# Patient Record
Sex: Female | Born: 1995
Health system: Southern US, Community
[De-identification: ages and names within clinical notes are randomized; demographics above are authoritative.]

## PROBLEM LIST (undated history)

## (undated) DIAGNOSIS — F419 Anxiety disorder, unspecified: Secondary | ICD-10-CM

## (undated) HISTORY — DX: Anxiety disorder, unspecified: F41.9

## (undated) HISTORY — PX: WISDOM TOOTH EXTRACTION: SHX21

---
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
## 2017-03-13 DIAGNOSIS — Z135 Encounter for screening for eye and ear disorders: Secondary | ICD-10-CM | POA: Diagnosis not present

## 2017-03-13 DIAGNOSIS — S060X0A Concussion without loss of consciousness, initial encounter: Secondary | ICD-10-CM | POA: Diagnosis not present

## 2017-03-16 DIAGNOSIS — S060X0A Concussion without loss of consciousness, initial encounter: Secondary | ICD-10-CM | POA: Diagnosis not present

## 2017-03-16 DIAGNOSIS — Z135 Encounter for screening for eye and ear disorders: Secondary | ICD-10-CM | POA: Diagnosis not present

## 2018-05-07 DIAGNOSIS — D225 Melanocytic nevi of trunk: Secondary | ICD-10-CM | POA: Diagnosis not present

## 2018-05-07 DIAGNOSIS — D2230 Melanocytic nevi of unspecified part of face: Secondary | ICD-10-CM | POA: Diagnosis not present

## 2018-05-07 DIAGNOSIS — D2260 Melanocytic nevi of unspecified upper limb, including shoulder: Secondary | ICD-10-CM | POA: Diagnosis not present

## 2018-05-07 DIAGNOSIS — Z1283 Encounter for screening for malignant neoplasm of skin: Secondary | ICD-10-CM | POA: Diagnosis not present

## 2018-08-14 DIAGNOSIS — Z6820 Body mass index (BMI) 20.0-20.9, adult: Secondary | ICD-10-CM | POA: Diagnosis not present

## 2018-08-14 DIAGNOSIS — Z113 Encounter for screening for infections with a predominantly sexual mode of transmission: Secondary | ICD-10-CM | POA: Diagnosis not present

## 2018-08-14 DIAGNOSIS — Z1159 Encounter for screening for other viral diseases: Secondary | ICD-10-CM | POA: Diagnosis not present

## 2018-08-14 DIAGNOSIS — Z118 Encounter for screening for other infectious and parasitic diseases: Secondary | ICD-10-CM | POA: Diagnosis not present

## 2018-08-14 DIAGNOSIS — Z114 Encounter for screening for human immunodeficiency virus [HIV]: Secondary | ICD-10-CM | POA: Diagnosis not present

## 2018-08-14 DIAGNOSIS — Z01419 Encounter for gynecological examination (general) (routine) without abnormal findings: Secondary | ICD-10-CM | POA: Diagnosis not present

## 2018-09-28 DIAGNOSIS — Z0184 Encounter for antibody response examination: Secondary | ICD-10-CM | POA: Diagnosis not present

## 2018-09-28 DIAGNOSIS — Z0000 Encounter for general adult medical examination without abnormal findings: Secondary | ICD-10-CM | POA: Diagnosis not present

## 2018-09-28 DIAGNOSIS — Z23 Encounter for immunization: Secondary | ICD-10-CM | POA: Diagnosis not present

## 2018-09-28 DIAGNOSIS — Z0100 Encounter for examination of eyes and vision without abnormal findings: Secondary | ICD-10-CM | POA: Diagnosis not present

## 2018-09-28 DIAGNOSIS — Z111 Encounter for screening for respiratory tuberculosis: Secondary | ICD-10-CM | POA: Diagnosis not present

## 2018-09-30 DIAGNOSIS — Z23 Encounter for immunization: Secondary | ICD-10-CM | POA: Diagnosis not present

## 2018-11-02 DIAGNOSIS — Z23 Encounter for immunization: Secondary | ICD-10-CM | POA: Diagnosis not present

## 2018-11-09 DIAGNOSIS — J039 Acute tonsillitis, unspecified: Secondary | ICD-10-CM | POA: Diagnosis not present

## 2018-12-07 DIAGNOSIS — Z0184 Encounter for antibody response examination: Secondary | ICD-10-CM | POA: Diagnosis not present

## 2019-01-19 DIAGNOSIS — J069 Acute upper respiratory infection, unspecified: Secondary | ICD-10-CM | POA: Diagnosis not present

## 2019-01-23 DIAGNOSIS — R6889 Other general symptoms and signs: Secondary | ICD-10-CM | POA: Diagnosis not present

## 2019-01-23 DIAGNOSIS — J101 Influenza due to other identified influenza virus with other respiratory manifestations: Secondary | ICD-10-CM | POA: Diagnosis not present

## 2019-03-22 DIAGNOSIS — J039 Acute tonsillitis, unspecified: Secondary | ICD-10-CM | POA: Diagnosis not present

## 2019-07-17 ENCOUNTER — Encounter: Payer: Self-pay | Admitting: Emergency Medicine

## 2019-07-17 ENCOUNTER — Emergency Department: Payer: BC Managed Care – PPO

## 2019-07-17 ENCOUNTER — Emergency Department
Admission: EM | Admit: 2019-07-17 | Discharge: 2019-07-17 | Disposition: A | Discharge status: Home / Self Care | Payer: BC Managed Care – PPO | Attending: Emergency Medicine | Admitting: Emergency Medicine

## 2019-07-17 ENCOUNTER — Other Ambulatory Visit: Payer: Self-pay

## 2019-07-17 DIAGNOSIS — R600 Localized edema: Secondary | ICD-10-CM | POA: Diagnosis not present

## 2019-07-17 DIAGNOSIS — K047 Periapical abscess without sinus: Secondary | ICD-10-CM | POA: Insufficient documentation

## 2019-07-17 DIAGNOSIS — R220 Localized swelling, mass and lump, head: Secondary | ICD-10-CM

## 2019-07-17 DIAGNOSIS — K0889 Other specified disorders of teeth and supporting structures: Secondary | ICD-10-CM | POA: Diagnosis not present

## 2019-07-17 LAB — CBC WITH DIFFERENTIAL/PLATELET
Abs Immature Granulocytes: 0.04 10*3/uL (ref 0.00–0.07)
Basophils Absolute: 0 10*3/uL (ref 0.0–0.1)
Basophils Relative: 0 %
Eosinophils Absolute: 0.1 10*3/uL (ref 0.0–0.5)
Eosinophils Relative: 1 %
HCT: 38.1 % (ref 36.0–46.0)
Hemoglobin: 12.3 g/dL (ref 12.0–15.0)
Immature Granulocytes: 0 %
Lymphocytes Relative: 15 %
Lymphs Abs: 1.4 10*3/uL (ref 0.7–4.0)
MCH: 28.4 pg (ref 26.0–34.0)
MCHC: 32.3 g/dL (ref 30.0–36.0)
MCV: 88 fL (ref 80.0–100.0)
Monocytes Absolute: 0.7 10*3/uL (ref 0.1–1.0)
Monocytes Relative: 8 %
Neutro Abs: 7 10*3/uL (ref 1.7–7.7)
Neutrophils Relative %: 76 %
Platelets: 223 10*3/uL (ref 150–400)
RBC: 4.33 MIL/uL (ref 3.87–5.11)
RDW: 13.7 % (ref 11.5–15.5)
WBC: 9.3 10*3/uL (ref 4.0–10.5)
nRBC: 0 % (ref 0.0–0.2)

## 2019-07-17 LAB — BASIC METABOLIC PANEL
Anion gap: 9 (ref 5–15)
BUN: 8 mg/dL (ref 6–20)
CO2: 21 mmol/L — ABNORMAL LOW (ref 22–32)
Calcium: 8.7 mg/dL — ABNORMAL LOW (ref 8.9–10.3)
Chloride: 108 mmol/L (ref 98–111)
Creatinine, Ser: 0.71 mg/dL (ref 0.44–1.00)
GFR calc Af Amer: >60 mL/min (ref >60)
GFR calc non Af Amer: >60 mL/min (ref >60)
Glucose, Bld: 80 mg/dL (ref 70–99)
Potassium: 3.7 mmol/L (ref 3.5–5.1)
Sodium: 138 mmol/L (ref 135–145)

## 2019-07-17 MED ORDER — IOHEXOL 300 MG/ML  SOLN
75.0000 mL | Freq: Once | INTRAMUSCULAR | Status: AC | PRN
  Administered 2019-07-17: 75 mL via INTRAVENOUS

## 2019-07-17 MED ORDER — LIDOCAINE HCL (PF) 1 % IJ SOLN
5.0000 mL | Freq: Once | INTRAMUSCULAR | Status: DC
  Filled 2019-07-17: qty 5

## 2019-07-17 MED ORDER — LIDOCAINE-EPINEPHRINE 2 %-1:100000 IJ SOLN
1.7000 mL | Freq: Once | INTRAMUSCULAR | Status: AC
  Administered 2019-07-17: 1.7 mL
  Filled 2019-07-17: qty 1.7

## 2019-07-17 MED ORDER — BUTAMBEN-TETRACAINE-BENZOCAINE 2-2-14 % EX AERO
1.0000 | INHALATION_SPRAY | Freq: Once | CUTANEOUS | Status: AC
  Administered 2019-07-17: 1 via TOPICAL
  Filled 2019-07-17: qty 20

## 2019-07-17 MED ORDER — MORPHINE SULFATE (PF) 4 MG/ML IV SOLN
4.0000 mg | Freq: Once | INTRAVENOUS | Status: AC
  Administered 2019-07-17: 4 mg via INTRAVENOUS
  Filled 2019-07-17: qty 1

## 2019-07-17 MED ORDER — LACTATED RINGERS IV BOLUS
1000.0000 mL | Freq: Once | INTRAVENOUS | Status: AC
  Administered 2019-07-17: 1000 mL via INTRAVENOUS

## 2019-07-17 MED ORDER — OXYCODONE-ACETAMINOPHEN 5-325 MG PO TABS: 1.0000 | ORAL_TABLET | ORAL | 0 refills | Status: DC | PRN

## 2019-07-17 MED ORDER — KETOROLAC TROMETHAMINE 30 MG/ML IJ SOLN
15.0000 mg | Freq: Once | INTRAMUSCULAR | Status: AC
  Administered 2019-07-17: 14:00:00 15 mg via INTRAVENOUS
  Filled 2019-07-17: qty 1

## 2019-07-17 MED ORDER — CLINDAMYCIN HCL 300 MG PO CAPS: 300.0000 mg | ORAL_CAPSULE | Freq: Three times a day (TID) | ORAL | 0 refills | Status: AC

## 2019-07-17 MED ORDER — CLINDAMYCIN PHOSPHATE 900 MG/50ML IV SOLN
900.0000 mg | Freq: Once | INTRAVENOUS | Status: AC
  Administered 2019-07-17: 900 mg via INTRAVENOUS
  Filled 2019-07-17: qty 50

## 2019-07-17 NOTE — ED Notes (Signed)
Urine preg NEG. 

## 2019-07-17 NOTE — ED Notes (Signed)
EDP Jess[place] notified in person that pt's pain inc again; pt states morphine made her very sleepy but didn't dec pain much. Pt attempting for urine sample now. If urine preg neg; EDP verbal for ketorolac.

## 2019-07-17 NOTE — ED Notes (Signed)
Lidocaine & cetacaine at bedside with equipment. Will notify EDP [place].

## 2019-07-17 NOTE — ED Provider Notes (Signed)
[facility] Emergency Department Provider Note   ____________________________________________   First MD Initiated Contact with Patient 07/17/19 1130     (approximate)  I have reviewed the triage vital signs and the nursing notes.   HISTORY  Chief Complaint Facial Swelling    HPI Madison Diaz is a 23 y.o. female with no significant past medical history presents to the ED complaining of facial pain and swelling.  Patient reports approximately 4 days of significant pain along her left lower molar.  She is now having pain with swallowing and reports swelling along the left side of her face and jaw.  She denies any fevers.  She does state she feels like her spit gets stuck in the back of her throat.  She spoke to her dentist over the phone, was prescribed amoxicillin and has taken about 4 doses.  She spoke to her dentist on the phone again today, who told her to come to the ED to get evaluated due to worsening swelling.        History reviewed. No pertinent past medical history.  There are no active problems to display for this patient.   History reviewed. No pertinent surgical history.  Prior to Admission medications   Medication Sig Start Date End Date Taking? Authorizing Provider  clindamycin (CLEOCIN) 300 MG capsule Take 1 capsule (300 mg total) by mouth 3 (three) times daily for 10 days. 07/17/19 07/27/19  Blake Divine, MD  oxyCODONE-acetaminophen (PERCOCET) 5-325 MG tablet Take 1 tablet by mouth every 4 (four) hours as needed for severe pain. 07/17/19 07/16/20  Blake Divine, MD    Allergies Patient has no known allergies.  No family history on file.  Social History Social History   Tobacco Use  . Smoking status: Never Smoker  . Smokeless tobacco: Never Used  Substance Use Topics  . Alcohol use: Not on file  . Drug use: Not on file    Review of Systems  Constitutional: No fever/chills Eyes: No visual changes. ENT: Positive for  sore throat, dental pain, and facial swelling. Cardiovascular: Denies chest pain. Respiratory: Denies shortness of breath. Gastrointestinal: No abdominal pain.  No nausea, no vomiting.  No diarrhea.  No constipation. Genitourinary: Negative for dysuria. Musculoskeletal: Negative for back pain. Skin: Negative for rash. Neurological: Negative for headaches, focal weakness or numbness.  ____________________________________________   PHYSICAL EXAM:  VITAL SIGNS: ED Triage Vitals  Enc Vitals Group     BP 07/17/19 1122 (!) 134/105     Pulse Rate 07/17/19 1122 (!) 128     Resp 07/17/19 1122 18     Temp 07/17/19 1122 99 F (37.2 C)     Temp Source 07/17/19 1122 Oral     SpO2 07/17/19 1122 100 %     Weight 07/17/19 1124 125 lb (56.7 kg)     Height 07/17/19 1124 5\' 6"  (1.676 m)     Head Circumference --      Peak Flow --      Pain Score 07/17/19 1124 9     Pain Loc --      Pain Edu? --      Excl. in [place]? --     Constitutional: Alert and oriented. Eyes: Conjunctivae are normal. Head: Atraumatic. Nose: No congestion/rhinnorhea. Mouth/Throat: Mucous membranes are moist.  Edema with focal tenderness to palpation in the area of left lower molar with apparent area of fluctuance.  Some submandibular edema and tenderness along with tenderness along outside of mandible.  No  trismus and she is tolerating secretions without difficulty. Neck: Normal ROM Cardiovascular: Normal rate, regular rhythm. Grossly normal heart sounds. Respiratory: Normal respiratory effort.  No retractions. Lungs CTAB. Gastrointestinal: Soft and nontender. No distention. Genitourinary: deferred Musculoskeletal: No lower extremity tenderness nor edema. Neurologic:  Normal speech and language. No gross focal neurologic deficits are appreciated. Skin:  Skin is warm, dry and intact. No rash noted. Psychiatric: Mood and affect are normal. Speech and behavior are normal.  ____________________________________________    LABS (all labs ordered are listed, but only abnormal results are displayed)  Labs Reviewed  BASIC METABOLIC PANEL - Abnormal; Notable for the following components:      Result Value   CO2 21 (*)    Calcium 8.7 (*)    All other components within normal limits  CBC WITH DIFFERENTIAL/PLATELET  POC URINE PREG, ED     PROCEDURES  Procedure(s) performed (including Critical Care):  Marland KitchenMarland KitchenIncision and Drainage  Date/Time: 07/17/2019 4:03 PM Performed by: Blake Divine, MD Authorized by: Blake Divine, MD   Consent:    Consent obtained:  Verbal   Consent given by:  Patient   Risks discussed:  Bleeding, incomplete drainage, pain, infection and damage to other organs   Alternatives discussed:  No treatment Location:    Type:  Abscess   Location:  Mouth   Mouth location:  Submandibular space Anesthesia (see MAR for exact dosages):    Anesthesia method:  Topical application and nerve block   Block location:  Inferior alveolar   Block needle gauge:  24 G   Block anesthetic:  Lidocaine 2% WITH epi   Block injection procedure:  Anatomic landmarks identified, negative aspiration for blood, incremental injection and introduced needle   Block outcome:  Anesthesia achieved Procedure type:    Complexity:  Simple Procedure details:    Needle aspiration: yes     Needle size:  22 G   Drainage:  Purulent and bloody   Drainage amount:  Scant   Wound treatment:  Wound left open Post-procedure details:    Patient tolerance of procedure:  Tolerated well, no immediate complications     ____________________________________________   INITIAL IMPRESSION / ASSESSMENT AND PLAN / ED COURSE       23 year old female presenting with pain along the area of left lower molar and worsening swelling with difficulty tolerating her spit.  She is not in any respiratory distress currently and appears to be tolerating her secretions without difficulty at this time.  Exam consistent with abscess in the  area of her left lower molar, not an easy location for bedside I&D.  Will obtain CT to further assess.  No evidence of Ludwig's angina as submandibular compartments are soft.  CT significant for alveolar abscess tracking into submandibular compartment.  Able to localize area of fluctuance with assistance of imaging, topical anesthesia applied with Cetacaine and inferior alveolar block performed.  Able to aspirate approximately 1 cc of purulent material using 22-gauge spinal needle, patient tolerated well without significant bleeding.  Will start patient on clindamycin, counseled on need to follow-up with her dentist for possible referral to oral surgery.  Otherwise counseled patient to return to the ED for new or worsening symptoms, patient agrees with plan.      ____________________________________________   FINAL CLINICAL IMPRESSION(S) / ED DIAGNOSES  Final diagnoses:  Dental abscess  Facial swelling     ED Discharge Orders         Ordered    oxyCODONE-acetaminophen (PERCOCET) 5-325 MG tablet  Every 4 hours PRN     07/17/19 1523    clindamycin (CLEOCIN) 300 MG capsule  3 times daily     07/17/19 1523           Note:  This document was prepared using Dragon voice recognition software and may include unintentional dictation errors.   Blake Divine, MD 07/17/19 1606

## 2019-07-17 NOTE — ED Notes (Signed)
Pt back to room.

## 2019-07-17 NOTE — ED Notes (Signed)
Pt given cup of iced water. Pt using sterile saline to swish and spit per EDP request.

## 2019-07-17 NOTE — ED Triage Notes (Addendum)
Pt arrived via POV with reports of impacted wisdom tooth on the left side, pt c/o swelling to left side of the face.  Pt c/o pain when swallowing. Pt states she started on antibiotics yesterday for dental abscess, pt states she has taken 4 doses of Amoxicillin. Pt also taking tylenol.  Pt has hx of tooth abscess in the past. Pt states she is having difficulty swallowing her saliva at times as well.   Pt able to talk in complete sentences, no respiratory distress noted.

## 2019-07-17 NOTE — ED Notes (Signed)
Pt given more warm blankets. Pt denies any other needs. Rail up. Bed locked low. Call bell within reach.

## 2019-07-17 NOTE — ED Notes (Signed)
Attempted for 20g IV at L ac.

## 2019-07-17 NOTE — ED Notes (Signed)
Will d/c once antibiotic finished. Not yet completed as pt kept bending arm. Pt understands to keep arm straight.

## 2019-07-17 NOTE — ED Notes (Signed)
Dental lidocaine given to [place].

## 2019-07-17 NOTE — ED Triage Notes (Signed)
First RN Note: pt presents to ED via POV with c/o possible impacted wisdom tooth. Pt's mother reports was referred by dentist. Pt states swelling to L side of face, feels like swelling is extending down into her throat and making it dificult to breathe. Pt is visualized in no respiratory distress at this time, ambulatory without difficulty at this time.

## 2019-07-17 NOTE — ED Notes (Signed)
Pt leaving for CT.  

## 2019-09-28 DIAGNOSIS — Z111 Encounter for screening for respiratory tuberculosis: Secondary | ICD-10-CM | POA: Diagnosis not present

## 2019-10-05 DIAGNOSIS — Z20828 Contact with and (suspected) exposure to other viral communicable diseases: Secondary | ICD-10-CM | POA: Diagnosis not present

## 2019-10-22 DIAGNOSIS — Z20828 Contact with and (suspected) exposure to other viral communicable diseases: Secondary | ICD-10-CM | POA: Diagnosis not present

## 2019-11-15 DIAGNOSIS — Z1151 Encounter for screening for human papillomavirus (HPV): Secondary | ICD-10-CM | POA: Diagnosis not present

## 2019-11-15 DIAGNOSIS — Z118 Encounter for screening for other infectious and parasitic diseases: Secondary | ICD-10-CM | POA: Diagnosis not present

## 2019-11-15 DIAGNOSIS — Z6821 Body mass index (BMI) 21.0-21.9, adult: Secondary | ICD-10-CM | POA: Diagnosis not present

## 2019-11-15 DIAGNOSIS — Z01419 Encounter for gynecological examination (general) (routine) without abnormal findings: Secondary | ICD-10-CM | POA: Diagnosis not present

## 2020-02-11 ENCOUNTER — Telehealth: Payer: Self-pay

## 2020-02-11 NOTE — Telephone Encounter (Signed)
Called lmom informing patient of appointment on 02/15/2020. klh 

## 2020-02-15 ENCOUNTER — Ambulatory Visit: Payer: BC Managed Care – PPO | Admitting: Adult Health

## 2020-02-24 ENCOUNTER — Telehealth: Payer: Self-pay

## 2020-02-24 NOTE — Telephone Encounter (Signed)
Confirmed appointment on 02/28/2020 and screened for covid. klh 

## 2020-02-28 ENCOUNTER — Other Ambulatory Visit: Payer: Self-pay

## 2020-02-28 ENCOUNTER — Ambulatory Visit: Payer: BC Managed Care – PPO | Admitting: Adult Health

## 2020-02-28 ENCOUNTER — Encounter (INDEPENDENT_AMBULATORY_CARE_PROVIDER_SITE_OTHER): Payer: Self-pay

## 2020-02-28 ENCOUNTER — Encounter: Payer: Self-pay | Admitting: Adult Health

## 2020-02-28 VITALS — BP 107/74 | HR 92 | Temp 98.2°F | Resp 16 | Ht 66.0 in | Wt 128.8 lb

## 2020-02-28 DIAGNOSIS — F411 Generalized anxiety disorder: Secondary | ICD-10-CM

## 2020-02-28 DIAGNOSIS — R002 Palpitations: Secondary | ICD-10-CM | POA: Diagnosis not present

## 2020-02-28 DIAGNOSIS — Z7689 Persons encountering health services in other specified circumstances: Secondary | ICD-10-CM

## 2020-02-28 DIAGNOSIS — F430 Acute stress reaction: Secondary | ICD-10-CM

## 2020-02-28 MED ORDER — ESCITALOPRAM OXALATE 10 MG PO TABS: 10.0000 mg | ORAL_TABLET | Freq: Every day | ORAL | 2 refills | Status: DC

## 2020-02-28 NOTE — Progress Notes (Signed)
[facility] [place], [place] [address]  Internal MEDICINE  Office Visit Note  Patient Name: Madison Diaz  [id number]  [id number]  Date of Service: 03/05/2020   Complaints/HPI Pt is here for establishment of PCP. Chief Complaint  Patient presents with  . New Patient (Initial Visit)   HPI Pt is here to establish care.  She is a well appearing 24 yo female.  She denies any medical history at this time. She currently only takes birth control.  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 She reports always being a high anxiety person.  She feels like her anxiety has become exponentially worse since starting nursing school.  She feels like her anxiety is so bad that it is affecting her ability to work on her school assignments.     Current Medication: Outpatient Encounter Medications as of 02/28/2020  Medication Sig  . norethindrone-ethinyl estradiol (LOESTRIN FE) 1-20 MG-MCG tablet Take 1 tablet by mouth daily.  Marland Kitchen escitalopram (LEXAPRO) 10 MG tablet Take 1 tablet (10 mg total) by mouth daily.  . [DISCONTINUED] oxyCODONE-acetaminophen (PERCOCET) 5-325 MG tablet Take 1 tablet by mouth every 4 (four) hours as needed for severe pain. (Patient not taking: Reported on 02/28/2020)   No facility-administered encounter medications on file as of 02/28/2020.    Surgical History: Past Surgical History:  Procedure Laterality Date  . WISDOM TOOTH EXTRACTION      Medical History: Past Medical History:  Diagnosis Date  . Anxiety     Family History: Family History  Problem Relation Age of Onset  . Breast cancer Other   . Heart disease Other     Social History   Socioeconomic History  . Marital status: Single    Spouse name: Not on file  . Number of children: Not on file  . Years of education: Not on file  . Highest education level: Not on file  Occupational History  . Not on file  Tobacco Use  . Smoking status: Never Smoker  . Smokeless  tobacco: Never Used  Substance and Sexual Activity  . Alcohol use: Yes    Comment: socially  . Drug use: Never  . Sexual activity: Not on file  Other Topics Concern  . Not on file  Social History Narrative  . Not on file   Social Determinants of Health   Financial Resource Strain:   . Difficulty of Paying Living Expenses:   Food Insecurity:   . Worried About Charity fundraiser in the Last Year:   . Arboriculturist in the Last Year:   Transportation Needs:   . Film/video editor (Medical):   Marland Kitchen Lack of Transportation (Non-Medical):   Physical Activity:   . Days of Exercise per Week:   . Minutes of Exercise per Session:   Stress:   . Feeling of Stress :   Social Connections:   . Frequency of Communication with Friends and Family:   . Frequency of Social Gatherings with Friends and Family:   . Attends Religious Services:   . Active Member of Clubs or Organizations:   . Attends Archivist Meetings:   Marland Kitchen Marital Status:   Intimate Partner Violence:   . Fear of Current or Ex-Partner:   . Emotionally Abused:   Marland Kitchen Physically Abused:   . Sexually Abused:      Review of Systems  Constitutional: Negative for chills, fatigue and unexpected weight change.  HENT: Negative for  congestion, rhinorrhea, sneezing and sore throat.   Eyes: Negative for photophobia, pain and redness.  Respiratory: Negative for cough, chest tightness and shortness of breath.   Cardiovascular: Negative for chest pain and palpitations.  Gastrointestinal: Negative for abdominal pain, constipation, diarrhea, nausea and vomiting.  Endocrine: Negative.   Genitourinary: Negative for dysuria and frequency.  Musculoskeletal: Negative for arthralgias, back pain, joint swelling and neck pain.  Skin: Negative for rash.  Allergic/Immunologic: Negative.   Neurological: Negative for tremors and numbness.  Hematological: Negative for adenopathy. Does not bruise/bleed easily.  Psychiatric/Behavioral:  Negative for behavioral problems and sleep disturbance. The patient is not nervous/anxious.     Vital Signs: BP 107/74   Pulse 92   Temp 98.2 F (36.8 C)   Resp 16   Ht 5\' 6"  (1.676 m)   Wt 128 lb 12.8 oz (58.4 kg)   SpO2 99%   BMI 20.79 kg/m    Physical Exam Vitals and nursing note reviewed.  Constitutional:      General: She is not in acute distress.    Appearance: She is well-developed. She is not diaphoretic.  HENT:     Head: Normocephalic and atraumatic.     Mouth/Throat:     Pharynx: No oropharyngeal exudate.  Eyes:     Pupils: Pupils are equal, round, and reactive to light.  Neck:     Thyroid: No thyromegaly.     Vascular: No JVD.     Trachea: No tracheal deviation.  Cardiovascular:     Rate and Rhythm: Normal rate and regular rhythm.     Heart sounds: Normal heart sounds. No murmur. No friction rub. No gallop.   Pulmonary:     Effort: Pulmonary effort is normal. No respiratory distress.     Breath sounds: Normal breath sounds. No wheezing or rales.  Chest:     Chest wall: No tenderness.  Abdominal:     Palpations: Abdomen is soft.     Tenderness: There is no abdominal tenderness. There is no guarding.  Musculoskeletal:        General: Normal range of motion.     Cervical back: Normal range of motion and neck supple.  Lymphadenopathy:     Cervical: No cervical adenopathy.  Skin:    General: Skin is warm and dry.  Neurological:     Mental Status: She is alert and oriented to person, place, and time.     Cranial Nerves: No cranial nerve deficit.  Psychiatric:        Behavior: Behavior normal.        Thought Content: Thought content normal.        Judgment: Judgment normal.    Assessment/Plan: 1. Encounter to establish care with new doctor Have baseline labs checked.  - CBC with Differential/Platelet - Lipid Panel With LDL/HDL Ratio - TSH - T4, free - Comprehensive metabolic panel  2. Anxiety as acute reaction to exceptional stress Discussed  taking daily anxiety medication like lexapro. Pt verbalized understanding of instructions, as well as need to taper medication if she wished to discontinue.   - escitalopram (LEXAPRO) 10 MG tablet; Take 1 tablet (10 mg total) by mouth daily.  Dispense: 30 tablet; Refill: 2  3. Palpitations Likely due to anxiety, will check labs, and consider echo at next visit.  General Counseling: Elvia verbalizes understanding of the findings of todays visit and agrees with plan of treatment. I have discussed any further diagnostic evaluation that may be needed or ordered today. We also reviewed  her medications today. she has been encouraged to call the office with any questions or concerns that should arise related to todays visit.  Orders Placed This Encounter  Procedures  . CBC with Differential/Platelet  . Lipid Panel With LDL/HDL Ratio  . TSH  . T4, free  . Comprehensive metabolic panel    Meds ordered this encounter  Medications  . escitalopram (LEXAPRO) 10 MG tablet    Sig: Take 1 tablet (10 mg total) by mouth daily.    Dispense:  30 tablet    Refill:  2    Time spent: 30 Minutes   This patient was seen by Orson Gear AGNP-C in Collaboration with Dr Lavera Guise as a part of collaborative care agreement  Kendell Bane AGNP-C Internal Medicine

## 2020-02-29 LAB — COMPREHENSIVE METABOLIC PANEL
ALT: 10 IU/L (ref 0–32)
AST: 19 IU/L (ref 0–40)
Albumin/Globulin Ratio: 1.7 (ref 1.2–2.2)
Albumin: 4.5 g/dL (ref 3.9–5.0)
Alkaline Phosphatase: 52 IU/L (ref 39–117)
BUN/Creatinine Ratio: 11 (ref 9–23)
BUN: 9 mg/dL (ref 6–20)
Bilirubin Total: 0.2 mg/dL (ref 0.0–1.2)
CO2: 20 mmol/L (ref 20–29)
Calcium: 9.7 mg/dL (ref 8.7–10.2)
Chloride: 104 mmol/L (ref 96–106)
Creatinine, Ser: 0.84 mg/dL (ref 0.57–1.00)
GFR calc Af Amer: 115 mL/min/{1.73_m2} (ref >59)
GFR calc non Af Amer: 100 mL/min/{1.73_m2} (ref >59)
Globulin, Total: 2.7 g/dL (ref 1.5–4.5)
Glucose: 85 mg/dL (ref 65–99)
Potassium: 4.8 mmol/L (ref 3.5–5.2)
Sodium: 141 mmol/L (ref 134–144)
Total Protein: 7.2 g/dL (ref 6.0–8.5)

## 2020-02-29 LAB — CBC WITH DIFFERENTIAL/PLATELET
Basophils Absolute: 0.1 10*3/uL (ref 0.0–0.2)
Basos: 1 %
EOS (ABSOLUTE): 0.2 10*3/uL (ref 0.0–0.4)
Eos: 3 %
Hematocrit: 41.4 % (ref 34.0–46.6)
Hemoglobin: 13.2 g/dL (ref 11.1–15.9)
Immature Grans (Abs): 0 10*3/uL (ref 0.0–0.1)
Immature Granulocytes: 0 %
Lymphocytes Absolute: 2.2 10*3/uL (ref 0.7–3.1)
Lymphs: 29 %
MCH: 28.4 pg (ref 26.6–33.0)
MCHC: 31.9 g/dL (ref 31.5–35.7)
MCV: 89 fL (ref 79–97)
Monocytes Absolute: 0.3 10*3/uL (ref 0.1–0.9)
Monocytes: 4 %
Neutrophils Absolute: 4.9 10*3/uL (ref 1.4–7.0)
Neutrophils: 63 %
Platelets: 307 10*3/uL (ref 150–450)
RBC: 4.64 x10E6/uL (ref 3.77–5.28)
RDW: 12.6 % (ref 11.7–15.4)
WBC: 7.6 10*3/uL (ref 3.4–10.8)

## 2020-02-29 LAB — LIPID PANEL WITH LDL/HDL RATIO
Cholesterol, Total: 171 mg/dL (ref 100–199)
HDL: 67 mg/dL (ref >39)
LDL Chol Calc (NIH): 77 mg/dL (ref 0–99)
LDL/HDL Ratio: 1.1 ratio (ref 0.0–3.2)
Triglycerides: 157 mg/dL — ABNORMAL HIGH (ref 0–149)
VLDL Cholesterol Cal: 27 mg/dL (ref 5–40)

## 2020-02-29 LAB — TSH: TSH: 2.18 u[IU]/mL (ref 0.450–4.500)

## 2020-02-29 LAB — T4, FREE: Free T4: 1.17 ng/dL (ref 0.82–1.77)

## 2020-03-16 NOTE — Progress Notes (Signed)
Labs will be discussed on next visit

## 2020-04-06 ENCOUNTER — Telehealth: Payer: Self-pay

## 2020-04-06 NOTE — Telephone Encounter (Signed)
Confirmed appointment on 04/10/2020 and screened for covid. klh

## 2020-04-10 ENCOUNTER — Other Ambulatory Visit: Payer: Self-pay

## 2020-04-10 ENCOUNTER — Encounter: Payer: Self-pay | Admitting: Adult Health

## 2020-04-10 ENCOUNTER — Ambulatory Visit: Payer: BC Managed Care – PPO | Admitting: Adult Health

## 2020-04-10 VITALS — BP 123/84 | HR 89 | Temp 98.0°F | Resp 16 | Ht 66.0 in | Wt 134.4 lb

## 2020-04-10 DIAGNOSIS — R002 Palpitations: Secondary | ICD-10-CM | POA: Diagnosis not present

## 2020-04-10 DIAGNOSIS — F411 Generalized anxiety disorder: Secondary | ICD-10-CM | POA: Diagnosis not present

## 2020-04-10 DIAGNOSIS — F430 Acute stress reaction: Secondary | ICD-10-CM

## 2020-04-10 MED ORDER — ESCITALOPRAM OXALATE 20 MG PO TABS: 20.0000 mg | ORAL_TABLET | Freq: Every day | ORAL | 2 refills | Status: DC

## 2020-04-10 NOTE — Progress Notes (Signed)
[facility] [place], [place] [address]  Internal MEDICINE  Office Visit Note  Patient Name: Madison Diaz  [id number]  [id number]  Date of Service: 04/10/2020  Chief Complaint  Patient presents with  . Follow-up    review labs     HPI  Pt is here to review labs. She is doing well.  She denies any new complaints at this time.  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  She is doing well. Her labs are WNL.  She has slightly elevated Triglycerides which she is willing to work on via diet and exercise.  She has been taking the lexapro for anxiety.  She has been doing well with this, however she feels like its "wearing off" in the evening before its time to take the next dose.  She reports on one instance of palpitations since starting lexapro.  This was at finals times and it was brief.     Current Medication: Outpatient Encounter Medications as of 04/10/2020  Medication Sig  . norethindrone-ethinyl estradiol (LOESTRIN FE) 1-20 MG-MCG tablet Take 1 tablet by mouth daily.  . [DISCONTINUED] escitalopram (LEXAPRO) 10 MG tablet Take 1 tablet (10 mg total) by mouth daily.  Marland Kitchen escitalopram (LEXAPRO) 20 MG tablet Take 1 tablet (20 mg total) by mouth daily.   No facility-administered encounter medications on file as of 04/10/2020.    Surgical History: Past Surgical History:  Procedure Laterality Date  . WISDOM TOOTH EXTRACTION      Medical History: Past Medical History:  Diagnosis Date  . Anxiety     Family History: Family History  Problem Relation Age of Onset  . Breast cancer Other   . Heart disease Other     Social History   Socioeconomic History  . Marital status: Single    Spouse name: Not on file  . Number of children: Not on file  . Years of education: Not on file  . Highest education level: Not on file  Occupational History  . Not on file  Tobacco Use  . Smoking status: Never Smoker  . Smokeless tobacco: Never Used  Substance and  Sexual Activity  . Alcohol use: Yes    Comment: socially  . Drug use: Never  . Sexual activity: Not on file  Other Topics Concern  . Not on file  Social History Narrative  . Not on file   Social Determinants of Health   Financial Resource Strain:   . Difficulty of Paying Living Expenses:   Food Insecurity:   . Worried About Charity fundraiser in the Last Year:   . Arboriculturist in the Last Year:   Transportation Needs:   . Film/video editor (Medical):   Marland Kitchen Lack of Transportation (Non-Medical):   Physical Activity:   . Days of Exercise per Week:   . Minutes of Exercise per Session:   Stress:   . Feeling of Stress :   Social Connections:   . Frequency of Communication with Friends and Family:   . Frequency of Social Gatherings with Friends and Family:   . Attends Religious Services:   . Active Member of Clubs or Organizations:   . Attends Archivist Meetings:   Marland Kitchen Marital Status:   Intimate Partner Violence:   . Fear of Current or Ex-Partner:   . Emotionally Abused:   Marland Kitchen Physically Abused:   . Sexually Abused:       Review of Systems  Constitutional: Negative for  chills, fatigue and unexpected weight change.  HENT: Negative for congestion, rhinorrhea, sneezing and sore throat.   Eyes: Negative for photophobia, pain and redness.  Respiratory: Negative for cough, chest tightness and shortness of breath.   Cardiovascular: Negative for chest pain and palpitations.  Gastrointestinal: Negative for abdominal pain, constipation, diarrhea, nausea and vomiting.  Endocrine: Negative.   Genitourinary: Negative for dysuria and frequency.  Musculoskeletal: Negative for arthralgias, back pain, joint swelling and neck pain.  Skin: Negative for rash.  Allergic/Immunologic: Negative.   Neurological: Negative for tremors and numbness.  Hematological: Negative for adenopathy. Does not bruise/bleed easily.  Psychiatric/Behavioral: Negative for behavioral problems and  sleep disturbance. The patient is not nervous/anxious.     Vital Signs: BP 123/84   Pulse 89   Temp 98 F (36.7 C)   Resp 16   Ht 5\' 6"  (1.676 m)   Wt 134 lb 6.4 oz (61 kg)   SpO2 98%   BMI 21.69 kg/m    Physical Exam Vitals and nursing note reviewed.  Constitutional:      General: She is not in acute distress.    Appearance: She is well-developed. She is not diaphoretic.  HENT:     Head: Normocephalic and atraumatic.     Mouth/Throat:     Pharynx: No oropharyngeal exudate.  Eyes:     Pupils: Pupils are equal, round, and reactive to light.  Neck:     Thyroid: No thyromegaly.     Vascular: No JVD.     Trachea: No tracheal deviation.  Cardiovascular:     Rate and Rhythm: Normal rate and regular rhythm.     Heart sounds: Normal heart sounds. No murmur. No friction rub. No gallop.   Pulmonary:     Effort: Pulmonary effort is normal. No respiratory distress.     Breath sounds: Normal breath sounds. No wheezing or rales.  Chest:     Chest wall: No tenderness.  Abdominal:     Palpations: Abdomen is soft.     Tenderness: There is no abdominal tenderness. There is no guarding.  Musculoskeletal:        General: Normal range of motion.     Cervical back: Normal range of motion and neck supple.  Lymphadenopathy:     Cervical: No cervical adenopathy.  Skin:    General: Skin is warm and dry.  Neurological:     Mental Status: She is alert and oriented to person, place, and time.     Cranial Nerves: No cranial nerve deficit.  Psychiatric:        Behavior: Behavior normal.        Thought Content: Thought content normal.        Judgment: Judgment normal.    Assessment/Plan: 1. Anxiety as acute reaction to exceptional stress Increase lexapro to 20mg  daily.  Follow up as discussed.  - escitalopram (LEXAPRO) 20 MG tablet; Take 1 tablet (20 mg total) by mouth daily.  Dispense: 30 tablet; Refill: 2  2. Palpitations Improving, only one episode in the last 6 weeks. Continue to  monitor.   General Counseling: Malayia verbalizes understanding of the findings of todays visit and agrees with plan of treatment. I have discussed any further diagnostic evaluation that may be needed or ordered today. We also reviewed her medications today. she has been encouraged to call the office with any questions or concerns that should arise related to todays visit.    No orders of the defined types were placed in this encounter.  Meds ordered this encounter  Medications  . escitalopram (LEXAPRO) 20 MG tablet    Sig: Take 1 tablet (20 mg total) by mouth daily.    Dispense:  30 tablet    Refill:  2    Time spent: 30 Minutes   This patient was seen by Orson Gear AGNP-C in Collaboration with Dr Lavera Guise as a part of collaborative care agreement     Kendell Bane AGNP-C Internal medicine

## 2020-04-24 ENCOUNTER — Other Ambulatory Visit: Payer: Self-pay

## 2020-04-24 DIAGNOSIS — F411 Generalized anxiety disorder: Secondary | ICD-10-CM

## 2020-04-24 MED ORDER — ESCITALOPRAM OXALATE 20 MG PO TABS: 20.0000 mg | ORAL_TABLET | Freq: Every day | ORAL | 0 refills | Status: DC

## 2020-05-18 ENCOUNTER — Telehealth: Payer: Self-pay

## 2020-05-18 NOTE — Telephone Encounter (Signed)
Patient rescheduled appointment on 05/22/2020 to 06/08/2020. klh

## 2020-05-22 ENCOUNTER — Ambulatory Visit: Payer: BC Managed Care – PPO | Admitting: Adult Health

## 2020-06-08 ENCOUNTER — Other Ambulatory Visit: Payer: Self-pay

## 2020-06-08 ENCOUNTER — Encounter: Payer: Self-pay | Admitting: Adult Health

## 2020-06-08 ENCOUNTER — Ambulatory Visit: Payer: BC Managed Care – PPO | Admitting: Adult Health

## 2020-06-08 VITALS — BP 103/63 | HR 83 | Temp 97.5°F | Resp 16 | Ht 66.0 in | Wt 136.0 lb

## 2020-06-08 DIAGNOSIS — F411 Generalized anxiety disorder: Secondary | ICD-10-CM | POA: Diagnosis not present

## 2020-06-08 DIAGNOSIS — R002 Palpitations: Secondary | ICD-10-CM | POA: Diagnosis not present

## 2020-06-08 DIAGNOSIS — F430 Acute stress reaction: Secondary | ICD-10-CM

## 2020-06-08 MED ORDER — ESCITALOPRAM OXALATE 20 MG PO TABS: 20.0000 mg | ORAL_TABLET | Freq: Every day | ORAL | 1 refills | Status: DC

## 2020-06-08 NOTE — Progress Notes (Signed)
[facility] [place], [place] [address]  Internal MEDICINE  Office Visit Note  Patient Name: Madison Diaz  [id number]  [id number]  Date of Service: 06/08/2020  Chief Complaint  Patient presents with  . Follow-up  . Quality Metric Gaps    pap smear    HPI  Pt is here for follow up.  At our last visit we increased her lexapro to 20mg  daily.  She is very happy with this and reports excellent control of her anxiety symptoms.  She is doing well in school and doing well. She denies any new or worsening symptoms.  She denies any recent issues with palpitations.        Current Medication: Outpatient Encounter Medications as of 06/08/2020  Medication Sig  . escitalopram (LEXAPRO) 20 MG tablet Take 1 tablet (20 mg total) by mouth daily.  . norethindrone-ethinyl estradiol (LOESTRIN FE) 1-20 MG-MCG tablet Take 1 tablet by mouth daily.  . [DISCONTINUED] escitalopram (LEXAPRO) 20 MG tablet Take 1 tablet (20 mg total) by mouth daily.   No facility-administered encounter medications on file as of 06/08/2020.    Surgical History: Past Surgical History:  Procedure Laterality Date  . WISDOM TOOTH EXTRACTION      Medical History: Past Medical History:  Diagnosis Date  . Anxiety     Family History: Family History  Problem Relation Age of Onset  . Breast cancer Other   . Heart disease Other     Social History   Socioeconomic History  . Marital status: Single    Spouse name: Not on file  . Number of children: Not on file  . Years of education: Not on file  . Highest education level: Not on file  Occupational History  . Not on file  Tobacco Use  . Smoking status: Never Smoker  . Smokeless tobacco: Never Used  Vaping Use  . Vaping Use: Never used  Substance and Sexual Activity  . Alcohol use: Yes    Comment: socially  . Drug use: Never  . Sexual activity: Not on file  Other Topics Concern  . Not on file  Social History Narrative  . Not on file    Social Determinants of Health   Financial Resource Strain:   . Difficulty of Paying Living Expenses:   Food Insecurity:   . Worried About Charity fundraiser in the Last Year:   . Arboriculturist in the Last Year:   Transportation Needs:   . Film/video editor (Medical):   Marland Kitchen Lack of Transportation (Non-Medical):   Physical Activity:   . Days of Exercise per Week:   . Minutes of Exercise per Session:   Stress:   . Feeling of Stress :   Social Connections:   . Frequency of Communication with Friends and Family:   . Frequency of Social Gatherings with Friends and Family:   . Attends Religious Services:   . Active Member of Clubs or Organizations:   . Attends Archivist Meetings:   Marland Kitchen Marital Status:   Intimate Partner Violence:   . Fear of Current or Ex-Partner:   . Emotionally Abused:   Marland Kitchen Physically Abused:   . Sexually Abused:       Review of Systems  Constitutional: Negative for chills, fatigue and unexpected weight change.  HENT: Negative for congestion, rhinorrhea, sneezing and sore throat.   Eyes: Negative for photophobia, pain and redness.  Respiratory: Negative for cough, chest tightness and shortness of breath.  Cardiovascular: Negative for chest pain and palpitations.  Gastrointestinal: Negative for abdominal pain, constipation, diarrhea, nausea and vomiting.  Endocrine: Negative.   Genitourinary: Negative for dysuria and frequency.  Musculoskeletal: Negative for arthralgias, back pain, joint swelling and neck pain.  Skin: Negative for rash.  Allergic/Immunologic: Negative.   Neurological: Negative for tremors and numbness.  Hematological: Negative for adenopathy. Does not bruise/bleed easily.  Psychiatric/Behavioral: Negative for behavioral problems and sleep disturbance. The patient is not nervous/anxious.     Vital Signs: BP 103/63   Pulse 83   Temp (!) 97.5 F (36.4 C)   Resp 16   Ht 5\' 6"  (1.676 m)   Wt 136 lb (61.7 kg)   SpO2 98%    BMI 21.95 kg/m    Physical Exam Vitals and nursing note reviewed.  Constitutional:      General: She is not in acute distress.    Appearance: She is well-developed. She is not diaphoretic.  HENT:     Head: Normocephalic and atraumatic.     Mouth/Throat:     Pharynx: No oropharyngeal exudate.  Eyes:     Pupils: Pupils are equal, round, and reactive to light.  Neck:     Thyroid: No thyromegaly.     Vascular: No JVD.     Trachea: No tracheal deviation.  Cardiovascular:     Rate and Rhythm: Normal rate and regular rhythm.     Heart sounds: Normal heart sounds. No murmur heard.  No friction rub. No gallop.   Pulmonary:     Effort: Pulmonary effort is normal. No respiratory distress.     Breath sounds: Normal breath sounds. No wheezing or rales.  Chest:     Chest wall: No tenderness.  Abdominal:     Palpations: Abdomen is soft.     Tenderness: There is no abdominal tenderness. There is no guarding.  Musculoskeletal:        General: Normal range of motion.     Cervical back: Normal range of motion and neck supple.  Lymphadenopathy:     Cervical: No cervical adenopathy.  Skin:    General: Skin is warm and dry.  Neurological:     Mental Status: She is alert and oriented to person, place, and time.     Cranial Nerves: No cranial nerve deficit.  Psychiatric:        Behavior: Behavior normal.        Thought Content: Thought content normal.        Judgment: Judgment normal.    Assessment/Plan: 1. Anxiety as acute reaction to exceptional stress Continue to take lexapro as directed.  If any new symptoms develop, return to clinic.    2. Palpitations Continue to monitor for symptoms. No recent issues.    General Counseling: [name] verbalizes understanding of the findings of todays visit and agrees with plan of treatment. I have discussed any further diagnostic evaluation that may be needed or ordered today. We also reviewed her medications today. she has been encouraged to call  the office with any questions or concerns that should arise related to todays visit.    No orders of the defined types were placed in this encounter.   Meds ordered this encounter  Medications  . escitalopram (LEXAPRO) 20 MG tablet    Sig: Take 1 tablet (20 mg total) by mouth daily.    Dispense:  90 tablet    Refill:  1    Patient has enough pills now, will call for refill when ready.  Time spent: 30 Minutes   This patient was seen by Orson Gear AGNP-C in Collaboration with Dr Lavera Guise as a part of collaborative care agreement     Kendell Bane AGNP-C Internal medicine

## 2020-10-09 ENCOUNTER — Ambulatory Visit: Payer: BC Managed Care – PPO | Admitting: Adult Health

## 2021-01-15 ENCOUNTER — Other Ambulatory Visit: Payer: Self-pay

## 2021-01-15 DIAGNOSIS — F411 Generalized anxiety disorder: Secondary | ICD-10-CM

## 2021-01-17 DIAGNOSIS — Z118 Encounter for screening for other infectious and parasitic diseases: Secondary | ICD-10-CM | POA: Diagnosis not present

## 2021-01-17 DIAGNOSIS — Z124 Encounter for screening for malignant neoplasm of cervix: Secondary | ICD-10-CM | POA: Diagnosis not present

## 2021-01-17 DIAGNOSIS — Z01411 Encounter for gynecological examination (general) (routine) with abnormal findings: Secondary | ICD-10-CM | POA: Diagnosis not present

## 2021-01-17 DIAGNOSIS — Z113 Encounter for screening for infections with a predominantly sexual mode of transmission: Secondary | ICD-10-CM | POA: Diagnosis not present

## 2021-01-17 DIAGNOSIS — Z01419 Encounter for gynecological examination (general) (routine) without abnormal findings: Secondary | ICD-10-CM | POA: Diagnosis not present

## 2021-01-18 ENCOUNTER — Other Ambulatory Visit: Payer: Self-pay

## 2021-01-18 DIAGNOSIS — F411 Generalized anxiety disorder: Secondary | ICD-10-CM

## 2021-01-18 MED ORDER — ESCITALOPRAM OXALATE 20 MG PO TABS: 20.0000 mg | ORAL_TABLET | Freq: Every day | ORAL | 0 refills | Status: DC

## 2021-02-08 ENCOUNTER — Ambulatory Visit: Payer: BC Managed Care – PPO | Admitting: Hospice and Palliative Medicine

## 2021-02-08 ENCOUNTER — Encounter: Payer: Self-pay | Admitting: Physician Assistant

## 2021-02-08 VITALS — BP 114/64 | HR 90 | Temp 97.6°F | Resp 16 | Ht 67.0 in | Wt 139.8 lb

## 2021-02-08 DIAGNOSIS — D229 Melanocytic nevi, unspecified: Secondary | ICD-10-CM | POA: Diagnosis not present

## 2021-02-08 DIAGNOSIS — F411 Generalized anxiety disorder: Secondary | ICD-10-CM | POA: Diagnosis not present

## 2021-02-08 MED ORDER — ESCITALOPRAM OXALATE 20 MG PO TABS: 20.0000 mg | ORAL_TABLET | Freq: Every day | ORAL | 1 refills | Status: DC

## 2021-02-08 NOTE — Progress Notes (Signed)
[facility] [place], [place] [address]  Internal MEDICINE  Office Visit Note  Patient Name: Madison Diaz  [id number]  [id number]  Date of Service: 02/10/2021  Chief Complaint  Patient presents with  . Follow-up    Refill request  . 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 norethindrone-ethinyl estradiol (LOESTRIN FE) 1-20 MG-MCG tablet Take 1 tablet by mouth daily.  . [DISCONTINUED] escitalopram (LEXAPRO) 20 MG tablet Take 1 tablet (20 mg total) by mouth daily.  Marland Kitchen escitalopram (LEXAPRO) 20 MG tablet Take 1 tablet (20 mg total) by mouth daily.   No facility-administered encounter medications on file as of 02/08/2021.    Surgical History: Past Surgical History:  Procedure Laterality Date  . WISDOM TOOTH EXTRACTION      Medical History: Past Medical History:  Diagnosis Date  . Anxiety     Family History: Family History  Problem Relation Age of Onset  . Breast cancer Other   . Heart disease Other     Social History   Socioeconomic History  . Marital status: Single    Spouse name: Not on file  . Number of children: Not on file  . Years of education: Not on file  . Highest education level: Not on file  Occupational History  . Not on file  Tobacco Use  . Smoking status: Never Smoker  . Smokeless tobacco:  Never Used  Vaping Use  . Vaping Use: Never used  Substance and Sexual Activity  . Alcohol use: Yes    Comment: socially  . Drug use: Never  . Sexual activity: Not on file  Other Topics Concern  . Not on file  Social History Narrative  . Not on file   Social Determinants of Health   Financial Resource Strain: Not on file  Food Insecurity: Not on file  Transportation Needs: Not on file  Physical Activity: Not on file  Stress: Not on file  Social Connections: Not on file  Intimate Partner Violence: Not on file      Review of Systems  Constitutional: Negative for chills, diaphoresis and fatigue.  HENT: Negative for ear pain, postnasal drip and sinus pressure.   Eyes: Negative for photophobia, discharge, redness, itching and visual disturbance.  Respiratory: Negative for cough, shortness of breath and wheezing.   Cardiovascular: Negative for chest pain, palpitations and leg swelling.  Gastrointestinal: Negative for abdominal pain, constipation, diarrhea, nausea and vomiting.  Genitourinary: Negative for dysuria and flank pain.  Musculoskeletal: Negative for arthralgias, back pain, gait problem and neck pain.  Skin: Negative for color change.  Allergic/Immunologic: Negative for environmental allergies and food allergies.  Neurological: Negative for dizziness and headaches.  Hematological: Does not bruise/bleed easily.  Psychiatric/Behavioral: Negative for agitation, behavioral problems (depression) and hallucinations.    Vital Signs: BP 114/64   Pulse 90   Temp 97.6 F (  36.4 C)   Resp 16   Ht 5\' 7"  (1.702 m)   Wt 139 lb 12.8 oz (63.4 kg)   SpO2 99%   BMI 21.90 kg/m    Physical Exam Vitals reviewed.  Constitutional:      Appearance: Normal appearance. She is normal weight.  Cardiovascular:     Rate and Rhythm: Normal rate and regular rhythm.     Pulses: Normal pulses.     Heart sounds: Normal heart sounds.  Pulmonary:     Effort: Pulmonary effort is normal.      Breath sounds: Normal breath sounds.  Abdominal:     General: Abdomen is flat.     Palpations: Abdomen is soft.  Musculoskeletal:        General: Normal range of motion.     Cervical back: Normal range of motion.  Skin:    General: Skin is warm.  Neurological:     General: No focal deficit present.     Mental Status: She is alert and oriented to person, place, and time. Mental status is at baseline.  Psychiatric:        Mood and Affect: Mood normal.        Behavior: Behavior normal.        Thought Content: Thought content normal.        Judgment: Judgment normal.    Assessment/Plan: 1. Change in multiple nevi Encouraged to contact dermatology offices in [place] as she prefers to find provider locally, if referral required advised to contact our office for referral to be placed  2. GAD (generalized anxiety disorder) Symptoms remain well controlled on current dose of Lexapro - escitalopram (LEXAPRO) 20 MG tablet; Take 1 tablet (20 mg total) by mouth daily.  Dispense: 90 tablet; Refill: 1  General Counseling: [name] verbalizes understanding of the findings of todays visit and agrees with plan of treatment. I have discussed any further diagnostic evaluation that may be needed or ordered today. We also reviewed her medications today. she has been encouraged to call the office with any questions or concerns that should arise related to todays visit.   Meds ordered this encounter  Medications  . escitalopram (LEXAPRO) 20 MG tablet    Sig: Take 1 tablet (20 mg total) by mouth daily.    Dispense:  90 tablet    Refill:  1    Time spent: 30 Minutes Time spent includes review of chart, medications, test results and follow-up plan with the patient.  This patient was seen by Theodoro Grist AGNP-C in Collaboration with Dr Lavera Guise as a part of collaborative care agreement     Tanna Furry. Zanden Colver AGNP-C Internal medicine

## 2021-02-10 ENCOUNTER — Encounter: Payer: Self-pay | Admitting: Hospice and Palliative Medicine

## 2021-02-27 DIAGNOSIS — J101 Influenza due to other identified influenza virus with other respiratory manifestations: Secondary | ICD-10-CM | POA: Diagnosis not present

## 2021-08-13 ENCOUNTER — Ambulatory Visit: Payer: BC Managed Care – PPO | Admitting: Nurse Practitioner

## 2021-08-13 ENCOUNTER — Telehealth: Payer: Self-pay

## 2021-08-13 NOTE — Telephone Encounter (Signed)
Left vm for patient to return call to reschedule f/u appointment-Toni

## 2021-09-03 ENCOUNTER — Ambulatory Visit: Payer: PRIVATE HEALTH INSURANCE | Admitting: Nurse Practitioner

## 2021-09-03 ENCOUNTER — Encounter: Payer: Self-pay | Admitting: Nurse Practitioner

## 2021-09-03 ENCOUNTER — Other Ambulatory Visit: Payer: Self-pay

## 2021-09-03 VITALS — BP 115/69 | HR 94 | Temp 98.7°F | Resp 16 | Ht 66.0 in | Wt 147.0 lb

## 2021-09-03 DIAGNOSIS — Z23 Encounter for immunization: Secondary | ICD-10-CM | POA: Diagnosis not present

## 2021-09-03 DIAGNOSIS — F411 Generalized anxiety disorder: Secondary | ICD-10-CM | POA: Diagnosis not present

## 2021-09-03 MED ORDER — ESCITALOPRAM OXALATE 20 MG PO TABS: 20.0000 mg | ORAL_TABLET | Freq: Every day | ORAL | 0 refills | Status: AC

## 2021-09-03 NOTE — Progress Notes (Signed)
[facility] [place], [place] [address]  Internal MEDICINE  Office Visit Note  Patient Name: Madison Diaz  [id number]  [id number]  Date of Service: 09/03/2021  Chief Complaint  Patient presents with   Follow-up    Refills and changing pcp   Anxiety    HPI Madison Diaz presents for a follow up visit for medication refills, anxiety and is unfortunately needing to change PCP. She lives and works in [place]. 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 She is needing her escitalopram refilled until she can find a new provider. She also is requesting her flu vaccine today.    Current Medication: Outpatient Encounter Medications as of 09/03/2021  Medication Sig   norethindrone-ethinyl estradiol (LOESTRIN FE) 1-20 MG-MCG tablet Take 1 tablet by mouth daily.   [DISCONTINUED] escitalopram (LEXAPRO) 20 MG tablet Take 1 tablet (20 mg total) by mouth daily.   escitalopram (LEXAPRO) 20 MG tablet Take 1 tablet (20 mg total) by mouth daily.   No facility-administered encounter medications on file as of 09/03/2021.    Surgical History: Past Surgical History:  Procedure Laterality Date   WISDOM TOOTH EXTRACTION      Medical History: Past Medical History:  Diagnosis Date   Anxiety     Family History: Family History  Problem Relation Age of Onset   Breast cancer Other    Heart disease Other     Social History   Socioeconomic History   Marital status: Single    Spouse name: Not on file   Number of children: Not on file   Years of education: Not on file   Highest education level: Not on file  Occupational History   Not on file  Tobacco Use   Smoking status: Never   Smokeless tobacco: Never  Vaping Use   Vaping Use: Never used  Substance and Sexual Activity   Alcohol use: Yes    Comment: socially   Drug use: Never   Sexual activity: Not on file  Other Topics Concern    Not on file  Social History Narrative   Not on file   Social Determinants of Health   Financial Resource Strain: Not on file  Food Insecurity: Not on file  Transportation Needs: Not on file  Physical Activity: Not on file  Stress: Not on file  Social Connections: Not on file  Intimate Partner Violence: Not on file      Review of Systems  Constitutional:  Negative for chills, fatigue and unexpected weight change.  HENT:  Negative for congestion, rhinorrhea, sneezing and sore throat.   Eyes:  Negative for redness.  Respiratory:  Negative for cough, chest tightness and shortness of breath.   Cardiovascular:  Negative for chest pain and palpitations.  Gastrointestinal:  Negative for abdominal pain, constipation, diarrhea, nausea and vomiting.  Genitourinary:  Negative for dysuria and frequency.  Musculoskeletal:  Negative for arthralgias, back pain, joint swelling and neck pain.  Skin:  Negative for rash.  Neurological: Negative.  Negative for tremors and numbness.  Hematological:  Negative for adenopathy. Does not bruise/bleed easily.  Psychiatric/Behavioral:  Negative for behavioral problems (Depression), self-injury, sleep disturbance and suicidal ideas. The patient is nervous/anxious.    Vital Signs: BP 115/69   Pulse 94   Temp 98.7 F (37.1 C)   Resp 16   Ht 5\' 6"  (1.676 m)   Wt 147 lb (66.7 kg)  SpO2 98%   BMI 23.73 kg/m    Physical Exam Vitals reviewed.  Constitutional:      General: She is not in acute distress.    Appearance: Normal appearance. She is normal weight. She is not ill-appearing.  HENT:     Head: Normocephalic and atraumatic.  Eyes:     Extraocular Movements: Extraocular movements intact.     Pupils: Pupils are equal, round, and reactive to light.  Cardiovascular:     Rate and Rhythm: Normal rate and regular rhythm.  Pulmonary:     Effort: Pulmonary effort is normal. No respiratory distress.  Neurological:     Mental Status: She is alert  and oriented to person, place, and time.     Cranial Nerves: No cranial nerve deficit.     Coordination: Coordination normal.     Gait: Gait normal.  Psychiatric:        Mood and Affect: Mood normal.        Behavior: Behavior normal.       Assessment/Plan: 1. GAD (generalized anxiety disorder) Stable, refill ordered.  - escitalopram (LEXAPRO) 20 MG tablet; Take 1 tablet (20 mg total) by mouth daily.  Dispense: 90 tablet; Refill: 0  2. Needs flu shot Administered in office today.  - Flu Vaccine MDCK QUAD PF   General Counseling: Madison Diaz verbalizes understanding of the findings of todays visit and agrees with plan of treatment. I have discussed any further diagnostic evaluation that may be needed or ordered today. We also reviewed her medications today. she has been encouraged to call the office with any questions or concerns that should arise related to todays visit.    Orders Placed This Encounter  Procedures   Flu Vaccine MDCK QUAD PF    Meds ordered this encounter  Medications   escitalopram (LEXAPRO) 20 MG tablet    Sig: Take 1 tablet (20 mg total) by mouth daily.    Dispense:  90 tablet    Refill:  0    Return for no follow up needed, patient needs to switch to [facility]CP due to insurance.   Total time spent:20 Minutes Time spent includes review of chart, medications, test results, and follow up plan with the patient.   [place] Controlled Substance Database was reviewed by me.  This patient was seen by Jonetta Osgood, FNP-C in collaboration with Dr. Clayborn Bigness as a part of collaborative care agreement.   Norvel Wenker R. Valetta Fuller, MSN, FNP-C Internal medicine
